# Patient Record
Sex: Male | Born: 1960
Health system: Southern US, Community
[De-identification: ages and names within clinical notes are randomized; demographics above are authoritative.]

## PROBLEM LIST (undated history)

## (undated) DIAGNOSIS — M79673 Pain in unspecified foot: Secondary | ICD-10-CM

## (undated) HISTORY — PX: INGUINAL HERNIA REPAIR: SUR1180

## (undated) HISTORY — DX: Pain in unspecified foot: M79.673

## (undated) HISTORY — PX: NECK SURGERY: SHX720

---

## 2002-05-21 ENCOUNTER — Ambulatory Visit (HOSPITAL_COMMUNITY): Admission: RE | Admit: 2002-05-21 | Discharge: 2002-05-21 | Payer: Self-pay | Admitting: Family Medicine

## 2002-05-21 ENCOUNTER — Encounter: Payer: Self-pay | Admitting: Family Medicine

## 2003-01-16 ENCOUNTER — Ambulatory Visit (HOSPITAL_COMMUNITY): Admission: RE | Admit: 2003-01-16 | Discharge: 2003-01-16 | Payer: Self-pay | Admitting: Internal Medicine

## 2003-01-16 ENCOUNTER — Encounter: Payer: Self-pay | Admitting: Internal Medicine

## 2003-01-17 ENCOUNTER — Encounter: Payer: Self-pay | Admitting: Orthopedic Surgery

## 2003-01-17 ENCOUNTER — Ambulatory Visit (HOSPITAL_COMMUNITY): Admission: RE | Admit: 2003-01-17 | Discharge: 2003-01-17 | Payer: Self-pay | Admitting: Orthopedic Surgery

## 2006-08-08 ENCOUNTER — Emergency Department (HOSPITAL_COMMUNITY): Admission: EM | Admit: 2006-08-08 | Discharge: 2006-08-08 | Payer: Self-pay | Admitting: Emergency Medicine

## 2011-04-08 LAB — CBC WITH DIFFERENTIAL/PLATELET
MCV: 101 fL
RBC: 4.15
WBC: 8.1
platelet count: 199

## 2011-04-08 LAB — COMPREHENSIVE METABOLIC PANEL
AST: 18 U/L
Albumin: 4.2
BUN: 9 mg/dL (ref 4–21)
CO2: 26 mmol/L
Calcium: 9.3 mg/dL
Chloride: 105 mmol/L
Glucose: 84 mg/dL
Potassium: 4.6 mmol/L

## 2011-04-13 ENCOUNTER — Telehealth: Payer: Self-pay

## 2011-04-13 NOTE — Telephone Encounter (Signed)
LMOM for pt to call. 

## 2011-05-03 NOTE — Telephone Encounter (Signed)
Letter mailed for pt to call.  

## 2011-05-24 ENCOUNTER — Telehealth: Payer: Self-pay

## 2011-05-24 NOTE — Telephone Encounter (Signed)
He left VM, i called and left him VM to call and schedule.

## 2011-05-24 NOTE — Telephone Encounter (Signed)
Pt called back to schedule and has seen blood on the tissue some. Scheduled for OV on 06/08/2011 with AS.

## 2011-06-08 ENCOUNTER — Ambulatory Visit (INDEPENDENT_AMBULATORY_CARE_PROVIDER_SITE_OTHER): Payer: Managed Care, Other (non HMO) | Admitting: Gastroenterology

## 2011-06-08 ENCOUNTER — Encounter: Payer: Self-pay | Admitting: Gastroenterology

## 2011-06-08 VITALS — BP 116/71 | HR 74 | Temp 98.4°F | Ht 74.0 in | Wt 231.2 lb

## 2011-06-08 DIAGNOSIS — K625 Hemorrhage of anus and rectum: Secondary | ICD-10-CM

## 2011-06-08 MED ORDER — SOD PICOSULFATE-MAG OX-CIT ACD 10-3.5-12 MG-GM-GM PO PACK: 1.0000 | PACK | Freq: Once | ORAL | Status: DC

## 2011-06-08 NOTE — Patient Instructions (Signed)
Start following a high fiber diet.   We haves set you up for a colonoscopy with Dr. Jena Gauss in the near future.   Further recommendations to follow.

## 2011-06-08 NOTE — Progress Notes (Signed)
Referring Provider: Dr. Regino Joseph Primary Care Physician:  Dr. Regino Joseph Primary Gastroenterologist:  Dr. Jena Joseph   Chief Complaint  Patient presents with  . Colonoscopy    HPI:   Chad Joseph presents today at the request of Dr. Regino Joseph for initial screening colonoscopy. Notes intermittent paper hematochezia. Remote hx of hemorrhoid flare but none currently. Denies constipation or diarrhea. No N/V, reflux, wt loss, lack of appetite. BC powders intermittently for headaches, up to every other day.   CBC from Dr. Edison Joseph office Nov 2012 without anemia.   Past Medical History  Diagnosis Date  . Foot pain     Past Surgical History  Procedure Date  . Neck surgery   . Inguinal hernia repair     Current Outpatient Prescriptions  Medication Sig Dispense Refill  . Aspirin-Salicylamide-Caffeine (BC HEADACHE POWDER PO) Take by mouth.      . hydrocortisone (ANUSOL-HC) 25 MG suppository Place 25 mg rectally 2 (two) times daily.       . meloxicam (MOBIC) 7.5 MG tablet Take 7.5 mg by mouth daily.       . Sod Picosulfate-Mag Ox-Cit Acd (PREPOPIK) 10-3.5-12 MG-GM-GM PACK Take 1 kit by mouth once.  1 each  0    Allergies as of 06/08/2011  . (No Known Allergies)    Family History  Problem Relation Age of Onset  . Colon cancer Neg Hx     History   Social History  . Marital Status: Married    Spouse Name: N/A    Number of Children: N/A  . 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 Smoking status: Current Some Day Smoker    Types: Cigars  . Smokeless tobacco: Not on file   Comment: one pack a week(5 in a pack)  . Alcohol Use: Yes     pint a week  . Drug Use: No  . Sexually Active: Not on file   Other Topics Concern  . Not on file   Social History Narrative  . No narrative on file    Review of Systems: Gen: Denies any fever, chills, loss of appetite, fatigue, weight loss. CV: Denies chest pain, heart  palpitations, syncope, peripheral edema. Resp: Denies shortness of breath with rest, cough, wheezing GI: Denies dysphagia or odynophagia. Denies hematemesis, fecal incontinence, or jaundice.  GU : Denies urinary burning, urinary frequency, urinary incontinence.  MS: Denies joint pain, muscle weakness, cramps, limited movement Derm: Denies rash, itching, dry skin Psych: Denies depression, anxiety, confusion or memory loss  Heme: Denies bruising, bleeding, and enlarged lymph nodes.  Physical Exam: BP 116/71  Pulse 74  Temp(Src) 98.4 F (36.9 C) (Temporal)  Ht 6\' 2"  (1.88 m)  Wt 231 lb 3.2 oz (104.872 kg)  BMI 29.68 kg/m2 General:   Alert and oriented. Well-developed, well-nourished, pleasant and cooperative. Head:  Normocephalic and atraumatic. Eyes:  Conjunctiva pink, sclera clear, no icterus.   Conjunctiva pink. Ears:  Normal auditory acuity. Nose:  No deformity, discharge,  or lesions. Mouth:  No deformity or lesions, mucosa pink and moist.  Neck:  Supple, without mass or thyromegaly. Lungs:  Clear to auscultation bilaterally, without wheezing, rales, or rhonchi.  Heart:  S1, S2 present without murmurs noted.  Abdomen:  +BS, soft, non-tender and non-distended. Without mass or HSM. No rebound or guarding. No hernias noted. Rectal:  Deferred  Msk:  Symmetrical without gross deformities. Normal posture. Pulses:  Normal pulses noted. Extremities:  Without clubbing or edema. Neurologic:  Alert and  oriented x4;  grossly normal neurologically. Skin:  Intact, warm and dry without significant lesions or rashes Cervical Nodes:  No significant cervical adenopathy. Psych:  Alert and cooperative. Normal mood and affect.

## 2011-06-09 ENCOUNTER — Encounter: Payer: Self-pay | Admitting: Gastroenterology

## 2011-06-09 DIAGNOSIS — K625 Hemorrhage of anus and rectum: Secondary | ICD-10-CM | POA: Insufficient documentation

## 2011-06-09 NOTE — Assessment & Plan Note (Signed)
51 year old male with need for screening colonoscopy in near future. Intermittent paper hematochezia noted recently, no other issues such as change in bowel habits, abdominal pain. Devoid of upper GI symptoms. Taking Goody's powders intermittently for headaches. Counseled to stop this. Likely low-volume hematochezia due to benign anorectal source. Proceed with TCS in near future.  Proceed with TCS with Dr. Jena Gauss in near future: the risks, benefits, and alternatives have been discussed with the patient in detail. The patient states understanding and desires to proceed. Avoid Goody's powders

## 2011-06-10 NOTE — Progress Notes (Signed)
Faxed to PCP

## 2011-07-07 MED ORDER — SODIUM CHLORIDE 0.45 % IV SOLN
Freq: Once | INTRAVENOUS | Status: AC
  Administered 2011-07-08: 09:00:00 via INTRAVENOUS

## 2011-07-08 ENCOUNTER — Other Ambulatory Visit: Payer: Self-pay | Admitting: Internal Medicine

## 2011-07-08 ENCOUNTER — Encounter (HOSPITAL_COMMUNITY): Payer: Self-pay | Admitting: *Deleted

## 2011-07-08 ENCOUNTER — Encounter (HOSPITAL_COMMUNITY)
Admission: RE | Disposition: A | Discharge status: Home / Self Care | Payer: Self-pay | Source: Ambulatory Visit | Attending: Internal Medicine

## 2011-07-08 ENCOUNTER — Ambulatory Visit (HOSPITAL_COMMUNITY)
Admission: RE | Admit: 2011-07-08 | Discharge: 2011-07-08 | Disposition: A | Discharge status: Home / Self Care | Payer: Managed Care, Other (non HMO) | Source: Ambulatory Visit | Attending: Internal Medicine | Admitting: Internal Medicine

## 2011-07-08 DIAGNOSIS — K625 Hemorrhage of anus and rectum: Secondary | ICD-10-CM

## 2011-07-08 DIAGNOSIS — D126 Benign neoplasm of colon, unspecified: Secondary | ICD-10-CM

## 2011-07-08 DIAGNOSIS — K921 Melena: Secondary | ICD-10-CM

## 2011-07-08 DIAGNOSIS — Z1211 Encounter for screening for malignant neoplasm of colon: Secondary | ICD-10-CM | POA: Insufficient documentation

## 2011-07-08 HISTORY — PX: COLONOSCOPY: SHX5424

## 2011-07-08 SURGERY — COLONOSCOPY
Anesthesia: Moderate Sedation

## 2011-07-08 MED ORDER — MIDAZOLAM HCL 5 MG/5ML IJ SOLN
INTRAMUSCULAR | Status: DC | PRN
  Administered 2011-07-08: 2 mg via INTRAVENOUS
  Administered 2011-07-08: 1 mg via INTRAVENOUS

## 2011-07-08 MED ORDER — MEPERIDINE HCL 100 MG/ML IJ SOLN
INTRAMUSCULAR | Status: DC | PRN
  Administered 2011-07-08: 25 mg via INTRAVENOUS
  Administered 2011-07-08: 50 mg via INTRAVENOUS

## 2011-07-08 MED ORDER — MIDAZOLAM HCL 5 MG/5ML IJ SOLN
INTRAMUSCULAR | Status: AC
  Filled 2011-07-08: qty 10

## 2011-07-08 MED ORDER — MEPERIDINE HCL 100 MG/ML IJ SOLN
INTRAMUSCULAR | Status: AC
  Filled 2011-07-08: qty 2

## 2011-07-08 MED ORDER — STERILE WATER FOR IRRIGATION IR SOLN
Status: DC | PRN
  Administered 2011-07-08: 09:00:00

## 2011-07-08 NOTE — H&P (View-Only) (Signed)
Referring Provider: Dr. McGough Primary Care Physician:  Dr. McGough Primary Gastroenterologist:  Dr. Rourk   Chief Complaint  Patient presents with  . Colonoscopy    HPI:   Chad Joseph presents today at the request of Dr. McGough for initial screening colonoscopy. Notes intermittent paper hematochezia. Remote hx of hemorrhoid flare but none currently. Denies constipation or diarrhea. No N/V, reflux, wt loss, lack of appetite. BC powders intermittently for headaches, up to every other day.   CBC from Dr. McGough's office Nov 2012 without anemia.   Past Medical History  Diagnosis Date  . Foot pain     Past Surgical History  Procedure Date  . Neck surgery   . Inguinal hernia repair     Current Outpatient Prescriptions  Medication Sig Dispense Refill  . Aspirin-Salicylamide-Caffeine (BC HEADACHE POWDER PO) Take by mouth.      . hydrocortisone (ANUSOL-HC) 25 MG suppository Place 25 mg rectally 2 (two) times daily.       . meloxicam (MOBIC) 7.5 MG tablet Take 7.5 mg by mouth daily.       . Sod Picosulfate-Mag Ox-Cit Acd (PREPOPIK) 10-3.5-12 MG-GM-GM PACK Take 1 kit by mouth once.  1 each  0    Allergies as of 06/08/2011  . (No Known Allergies)    Family History  Problem Relation Age of Onset  . Colon cancer Neg Hx     History   Social History  . Marital Status: Married    Spouse Name: N/A    Number of Children: N/A  . Years of Education: N/A   Occupational History  . [employer]    Social History Main Topics  . Smoking status: Current Some Day Smoker    Types: Cigars  . Smokeless tobacco: Not on file   Comment: one pack a week(5 in a pack)  . Alcohol Use: Yes     pint a week  . Drug Use: No  . Sexually Active: Not on file   Other Topics Concern  . Not on file   Social History Narrative  . No narrative on file    Review of Systems: Gen: Denies any fever, chills, loss of appetite, fatigue, weight loss. CV: Denies chest pain, heart  palpitations, syncope, peripheral edema. Resp: Denies shortness of breath with rest, cough, wheezing GI: Denies dysphagia or odynophagia. Denies hematemesis, fecal incontinence, or jaundice.  GU : Denies urinary burning, urinary frequency, urinary incontinence.  MS: Denies joint pain, muscle weakness, cramps, limited movement Derm: Denies rash, itching, dry skin Psych: Denies depression, anxiety, confusion or memory loss  Heme: Denies bruising, bleeding, and enlarged lymph nodes.  Physical Exam: BP 116/71  Pulse 74  Temp(Src) 98.4 F (36.9 C) (Temporal)  Ht 6' 2" (1.88 m)  Wt 231 lb 3.2 oz (104.872 kg)  BMI 29.68 kg/m2 General:   Alert and oriented. Well-developed, well-nourished, pleasant and cooperative. Head:  Normocephalic and atraumatic. Eyes:  Conjunctiva pink, sclera clear, no icterus.   Conjunctiva pink. Ears:  Normal auditory acuity. Nose:  No deformity, discharge,  or lesions. Mouth:  No deformity or lesions, mucosa pink and moist.  Neck:  Supple, without mass or thyromegaly. Lungs:  Clear to auscultation bilaterally, without wheezing, rales, or rhonchi.  Heart:  S1, S2 present without murmurs noted.  Abdomen:  +BS, soft, non-tender and non-distended. Without mass or HSM. No rebound or guarding. No hernias noted. Rectal:  Deferred  Msk:  Symmetrical without gross deformities. Normal posture. Pulses:  Normal pulses noted. Extremities:    Without clubbing or edema. Neurologic:  Alert and  oriented x4;  grossly normal neurologically. Skin:  Intact, warm and dry without significant lesions or rashes Cervical Nodes:  No significant cervical adenopathy. Psych:  Alert and cooperative. Normal mood and affect.   

## 2011-07-08 NOTE — Interval H&P Note (Signed)
History and Physical Interval Note:  07/08/2011 9:14 AM  Chad Joseph  has presented today for surgery, with the diagnosis of RECTAL BLEEDING  The various methods of treatment have been discussed with the patient and family. After consideration of risks, benefits and other options for treatment, the patient has consented to  Procedure(s) (LRB): COLONOSCOPY (N/A) as a surgical intervention .  The patients' history has been reviewed, patient examined, no change in status, stable for surgery.  I have reviewed the patients' chart and labs.  Questions were answered to the patient's satisfaction.     Eula Listen

## 2011-07-08 NOTE — Discharge Instructions (Addendum)
Colonoscopy Discharge Instructions  Read the instructions outlined below and refer to this sheet in the next few weeks. These discharge instructions provide you with general information on caring for yourself after you leave the hospital. Your doctor may also give you specific instructions. While your treatment has been planned according to the most current medical practices available, unavoidable complications occasionally occur. If you have any problems or questions after discharge, call Dr. Jena Gauss at [phone]. ACTIVITY  You may resume your regular activity, but move at a slower pace for the next 24 hours.   Take frequent rest periods for the next 24 hours.   Walking will help get rid of the air and reduce the bloated feeling in your belly (abdomen).   No driving for 24 hours (because of the medicine (anesthesia) used during the test).    Do not sign any important legal documents or operate any machinery for 24 hours (because of the anesthesia used during the test).  NUTRITION  Drink plenty of fluids.   You may resume your normal diet as instructed by your doctor.   Begin with a light meal and progress to your normal diet. Heavy or fried foods are harder to digest and may make you feel sick to your stomach (nauseated).   Avoid alcoholic beverages for 24 hours or as instructed.  MEDICATIONS  You may resume your normal medications unless your doctor tells you otherwise.  WHAT YOU CAN EXPECT TODAY  Some feelings of bloating in the abdomen.   Passage of more gas than usual.   Spotting of blood in your stool or on the toilet paper.  IF YOU HAD POLYPS REMOVED DURING THE COLONOSCOPY:  No aspirin products for 7 days or as instructed.   No alcohol for 7 days or as instructed.   Eat a soft diet for the next 24 hours.  FINDING OUT THE RESULTS OF YOUR TEST Not all test results are available during your visit. If your test results are not back during the visit, make an appointment  with your caregiver to find out the results. Do not assume everything is normal if you have not heard from your caregiver or the medical facility. It is important for you to follow up on all of your test results.  SEEK IMMEDIATE MEDICAL ATTENTION IF:  You have more than a spotting of blood in your stool.   Your belly is swollen (abdominal distention).   You are nauseated or vomiting.   You have a temperature over 101.   You have abdominal pain or discomfort that is severe or gets worse throughout the day.    Polyp and hemorrhoid information provided.  Daily Benefiber-  fiber supplement 2 tablespoons.  One-week course of Anusol suppositories as directed.  Further recommendations to follow pending review of pathology report.Hemorrhoids Hemorrhoids are enlarged (dilated) veins around the rectum. There are 2 types of hemorrhoids, and the type of hemorrhoid is determined by its location. Internal hemorrhoids occur in the veins just inside the rectum.They are usually not painful, but they may bleed.However, they may poke through to the outside and become irritated and painful. External hemorrhoids involve the veins outside the anus and can be felt as a painful swelling or hard lump near the anus.They are often itchy and may crack and bleed. Sometimes clots will form in the veins. This makes them swollen and painful. These are called thrombosed hemorrhoids. CAUSES Causes of hemorrhoids include:  Pregnancy. This increases the pressure in the hemorrhoidal veins.  Constipation.   Straining to have a bowel movement.   Obesity.   Heavy lifting or other activity that caused you to strain.  TREATMENT Most of the time hemorrhoids improve in 1 to 2 weeks. However, if symptoms do not seem to be getting better or if you have a lot of rectal bleeding, your caregiver may perform a procedure to help make the hemorrhoids get smaller or remove them completely.Possible treatments include:  Rubber  band ligation. A rubber band is placed at the base of the hemorrhoid to cut off the circulation.   Sclerotherapy. A chemical is injected to shrink the hemorrhoid.   Infrared light therapy. Tools are used to burn the hemorrhoid.   Hemorrhoidectomy. This is surgical removal of the hemorrhoid.  HOME CARE INSTRUCTIONS   Increase fiber in your diet. Ask your caregiver about using fiber supplements.   Drink enough water and fluids to keep your urine clear or pale yellow.   Exercise regularly.   Go to the bathroom when you have the urge to have a bowel movement. Do not wait.   Avoid straining to have bowel movements.   Keep the anal area dry and clean.   Only take over-the-counter or prescription medicines for pain, discomfort, or fever as directed by your caregiver.  If your hemorrhoids are thrombosed:  Take warm sitz baths for 20 to 30 minutes, 3 to 4 times per day.   If the hemorrhoids are very tender and swollen, place ice packs on the area as tolerated. Using ice packs between sitz baths may be helpful. Fill a plastic bag with ice. Place a towel between the bag of ice and your skin.   Medicated creams and suppositories may be used or applied as directed.   Do not use a donut-shaped pillow or sit on the toilet for long periods. This increases blood pooling and pain.  SEEK MEDICAL CARE IF:   You have increasing pain and swelling that is not controlled with your medicine.   You have uncontrolled bleeding.   You have difficulty or you are unable to have a bowel movement.   You have pain or inflammation outside the area of the hemorrhoids.   You have chills or an oral temperature above 102 F (38.9 C).  MAKE SURE YOU:   Understand these instructions.   Will watch your condition.   Will get help right away if you are not doing well or get worse.  Document Released: 04/29/2000 Document Revised: 01/12/2011 Document Reviewed: 09/04/2007 [facility] Patient Information 2012  [place], [place].Colon Polyps A polyp is extra tissue that grows inside your body. Colon polyps grow in the large intestine. The large intestine, also called the colon, is part of your digestive system. It is a long, hollow tube at the end of your digestive tract where your body makes and stores stool. Most polyps are not dangerous. They are benign. This means they are not cancerous. But over time, some types of polyps can turn into cancer. Polyps that are smaller than a pea are usually not harmful. But larger polyps could someday become or may already be cancerous. To be safe, doctors remove all polyps and test them.  WHO GETS POLYPS? Anyone can get polyps, but certain people are more likely than others. You may have a greater chance of getting polyps if:  You are over 50.   You have had polyps before.   Someone in your family has had polyps.   Someone in your family has had cancer  of the large intestine.   Find out if someone in your family has had polyps. You may also be more likely to get polyps if you:   Eat a lot of fatty foods.   Smoke.   Drink alcohol.   Do not exercise.   Eat too much.  SYMPTOMS  Most small polyps do not cause symptoms. People often do not know they have one until their caregiver finds it during a regular checkup or while testing them for something else. Some people do have symptoms like these:  Bleeding from the anus. You might notice blood on your underwear or on toilet paper after you have had a bowel movement.   Constipation or diarrhea that lasts more than a week.   Blood in the stool. Blood can make stool look black or it can show up as red streaks in the stool.  If you have any of these symptoms, see your caregiver. HOW DOES THE DOCTOR TEST FOR POLYPS? The doctor can use four tests to check for polyps:  Digital rectal exam. The caregiver wears gloves and checks your rectum (the last part of the large intestine) to see if it feels normal. This test  would find polyps only in the rectum. Your caregiver may need to do one of the other tests listed below to find polyps higher up in the intestine.   Barium enema. The caregiver puts a liquid called barium into your rectum before taking x-rays of your large intestine. Barium makes your intestine look white in the pictures. Polyps are dark, so they are easy to see.   Sigmoidoscopy. With this test, the caregiver can see inside your large intestine. A thin flexible tube is placed into your rectum. The device is called a sigmoidoscope, which has a light and a tiny video camera in it. The caregiver uses the sigmoidoscope to look at the last third of your large intestine.   Colonoscopy. This test is like sigmoidoscopy, but the caregiver looks at all of the large intestine. It usually requires sedation. This is the most common method for finding and removing polyps.  TREATMENT   The caregiver will remove the polyp during sigmoidoscopy or colonoscopy. The polyp is then tested for cancer.   If you have had polyps, your caregiver may want you to get tested regularly in the future.  PREVENTION  There is not one sure way to prevent polyps. You might be able to lower your risk of getting them if you:  Eat more fruits and vegetables and less fatty food.   Do not smoke.   Avoid alcohol.   Exercise every day.   Lose weight if you are overweight.   Eating more calcium and folate can also lower your risk of getting polyps. Some foods that are rich in calcium are milk, cheese, and broccoli. Some foods that are rich in folate are chickpeas, kidney beans, and spinach.   Aspirin might help prevent polyps. Studies are under way.  Document Released: 01/27/2004 Document Revised: 01/12/2011 Document Reviewed: 07/04/2007 [facility] Patient Information 2012 [place], [place].

## 2011-07-08 NOTE — Op Note (Signed)
[facility] [address] [place], [place]  [address]  COLONOSCOPY PROCEDURE REPORT  PATIENT:  Chad Joseph, Chad Joseph  MR#:  [id number] BIRTHDATE:  1960-06-30, 50 yrs. old  GENDER:  male ENDOSCOPIST:  R. Roetta Sessions, MD FACP [facility] REF. BY:  Karleen Hampshire, M.D. PROCEDURE DATE:  07/08/2011 PROCEDURE:  colonoscopy with snare polypectomy  INDICATIONS:  First ever average risk screening colonoscopy; He does report occasional small amount of blood per rectum-painless  INFORMED CONSENT:  The risks, benefits, alternatives and imponderables including but not limited to bleeding, perforation as well as the possibility of a missed lesion have been reviewed. The potential for biopsy, lesion removal, etc. have also been discussed.  Questions have been answered.  All parties agreeable. Please see the history and physical in the medical record for more information.  MEDICATIONS:  Versed 3 mg IV and Demerol 75 mg IV in divided doses.  DESCRIPTION OF PROCEDURE:  After a digital rectal exam was performed, the EC-[id number] ([id number]) colonoscope was advanced from the anus through the rectum and colon to the area of the cecum, ileocecal valve and appendiceal orifice.  The cecum was deeply intubated.  These structures were well-seen and photographed for the record.  From the level of the cecum and ileocecal valve, the scope was slowly and cautiously withdrawn.  The mucosal surfaces were carefully surveyed utilizing scope tip deflection to facilitate fold flattening as needed.  Poor prep.  The scope was pulled down into the rectum where a thorough examination. Carried out;  I attempted to perform retroflexion but was unable to do so. <<PROCEDUREIMAGES>>  FINDINGS:  marginal preparation. A small lesion may have  been obscured by the poor prep.  Somewhat friable anorectum. A 5 mm flat adenomatous-appearing polyp in the sigmoid segment; otherwise the colonic mucosa appeared grossly  normal.  THERAPEUTIC / DIAGNOSTIC MANEUVERS PERFORMED:   The above-mentioned polyp was hot snare removed.  COMPLICATIONS:  none  CECAL WITHDRAWAL TIME:   16 minutes.  IMPRESSION:  Friable anorectum-likely source of hematochezia. Sigmoid polyp-removed as described above. Poor prep limited the examination.  RECOMMENDATIONS:   Follow-up on pathology. Add Benefiber-fiber supplement daily. Continue Anusol suppositories. Further recommendations to follow  ______________________________ R. Roetta Sessions, MD Caleen Essex  CC:  Karleen Hampshire, M.D.  n. eSIGNED:   R. Roetta Sessions at 07/08/2011 10:14 AM  Evelena Asa,   [phone]

## 2011-07-13 ENCOUNTER — Encounter (HOSPITAL_COMMUNITY): Payer: Self-pay | Admitting: Internal Medicine

## 2011-07-16 ENCOUNTER — Encounter: Payer: Self-pay | Admitting: Internal Medicine

## 2016-08-17 DIAGNOSIS — Z0000 Encounter for general adult medical examination without abnormal findings: Secondary | ICD-10-CM | POA: Diagnosis not present

## 2016-08-17 DIAGNOSIS — D539 Nutritional anemia, unspecified: Secondary | ICD-10-CM | POA: Diagnosis not present

## 2016-08-17 DIAGNOSIS — Z6828 Body mass index (BMI) 28.0-28.9, adult: Secondary | ICD-10-CM | POA: Diagnosis not present

## 2016-08-17 DIAGNOSIS — Z1389 Encounter for screening for other disorder: Secondary | ICD-10-CM | POA: Diagnosis not present

## 2016-11-25 DIAGNOSIS — E663 Overweight: Secondary | ICD-10-CM | POA: Diagnosis not present

## 2016-11-25 DIAGNOSIS — D509 Iron deficiency anemia, unspecified: Secondary | ICD-10-CM | POA: Diagnosis not present

## 2016-11-25 DIAGNOSIS — Z1389 Encounter for screening for other disorder: Secondary | ICD-10-CM | POA: Diagnosis not present

## 2016-11-25 DIAGNOSIS — Z6828 Body mass index (BMI) 28.0-28.9, adult: Secondary | ICD-10-CM | POA: Diagnosis not present

## 2017-02-24 DIAGNOSIS — Z1389 Encounter for screening for other disorder: Secondary | ICD-10-CM | POA: Diagnosis not present

## 2017-02-24 DIAGNOSIS — E663 Overweight: Secondary | ICD-10-CM | POA: Diagnosis not present

## 2017-02-24 DIAGNOSIS — M5410 Radiculopathy, site unspecified: Secondary | ICD-10-CM | POA: Diagnosis not present

## 2017-02-24 DIAGNOSIS — Z6828 Body mass index (BMI) 28.0-28.9, adult: Secondary | ICD-10-CM | POA: Diagnosis not present

## 2017-06-26 ENCOUNTER — Encounter: Payer: Self-pay | Admitting: Adult Health

## 2017-06-26 NOTE — Progress Notes (Signed)
This encounter was created in error - please disregard.

## 2017-07-28 DIAGNOSIS — Z1389 Encounter for screening for other disorder: Secondary | ICD-10-CM | POA: Diagnosis not present

## 2017-07-28 DIAGNOSIS — J029 Acute pharyngitis, unspecified: Secondary | ICD-10-CM | POA: Diagnosis not present

## 2017-07-28 DIAGNOSIS — E663 Overweight: Secondary | ICD-10-CM | POA: Diagnosis not present

## 2017-07-28 DIAGNOSIS — Z6828 Body mass index (BMI) 28.0-28.9, adult: Secondary | ICD-10-CM | POA: Diagnosis not present

## 2019-06-25 DIAGNOSIS — Z6828 Body mass index (BMI) 28.0-28.9, adult: Secondary | ICD-10-CM | POA: Diagnosis not present

## 2019-06-25 DIAGNOSIS — Z1389 Encounter for screening for other disorder: Secondary | ICD-10-CM | POA: Diagnosis not present

## 2019-06-25 DIAGNOSIS — E663 Overweight: Secondary | ICD-10-CM | POA: Diagnosis not present

## 2019-06-25 DIAGNOSIS — Z0000 Encounter for general adult medical examination without abnormal findings: Secondary | ICD-10-CM | POA: Diagnosis not present

## 2019-07-14 ENCOUNTER — Other Ambulatory Visit: Payer: Self-pay

## 2019-07-14 ENCOUNTER — Ambulatory Visit: Payer: Self-pay | Attending: Internal Medicine

## 2019-07-14 DIAGNOSIS — Z23 Encounter for immunization: Secondary | ICD-10-CM | POA: Insufficient documentation

## 2019-07-14 NOTE — Progress Notes (Signed)
   Covid-19 Vaccination Clinic  Name:  Chad Joseph    MRN: [id number] DOB: 10-Nov-1960  07/14/2019  Mr. [name] was observed post Covid-19 immunization for 15 minutes without incidence. He was provided with Vaccine Information Sheet and instruction to access the V-Safe system.   Mr. [name] was instructed to call 911 with any severe reactions post vaccine: Marland Kitchen Difficulty breathing  . Swelling of your face and throat  . A fast heartbeat  . A bad rash all over your body  . Dizziness and weakness    Immunizations Administered    Name Date Dose VIS Date Route   Moderna COVID-19 Vaccine 07/14/2019  9:05 AM 0.5 mL 04/16/2019 Intramuscular   Manufacturer: Moderna   Lot: [id number]   [id number]

## 2019-08-17 ENCOUNTER — Ambulatory Visit: Payer: Self-pay | Attending: Internal Medicine

## 2019-08-17 DIAGNOSIS — Z23 Encounter for immunization: Secondary | ICD-10-CM

## 2019-08-17 NOTE — Progress Notes (Signed)
   Covid-19 Vaccination Clinic  Name:  Chad Joseph    MRN: [id number] DOB: 05-14-1961  08/17/2019  Chad Joseph was observed post Covid-19 immunization for 15 minutes without incident. He was provided with Vaccine Information Sheet and instruction to access the V-Safe system.   Chad Joseph was instructed to call 911 with any severe reactions post vaccine: Marland Kitchen Difficulty breathing  . Swelling of face and throat  . A fast heartbeat  . A bad rash all over body  . Dizziness and weakness   Immunizations Administered    Name Date Dose VIS Date Route   Moderna COVID-19 Vaccine 08/17/2019  9:05 AM 0.5 mL 04/16/2019 Intramuscular   Manufacturer: Moderna   Lot: [id number]   [id number]

## 2020-03-12 ENCOUNTER — Other Ambulatory Visit: Payer: Self-pay

## 2020-03-12 ENCOUNTER — Ambulatory Visit
Admission: EM | Admit: 2020-03-12 | Discharge: 2020-03-12 | Disposition: A | Discharge status: Home / Self Care | Payer: BC Managed Care – PPO | Attending: Emergency Medicine | Admitting: Emergency Medicine

## 2020-03-12 DIAGNOSIS — Z1152 Encounter for screening for COVID-19: Secondary | ICD-10-CM | POA: Diagnosis not present

## 2020-03-12 DIAGNOSIS — J069 Acute upper respiratory infection, unspecified: Secondary | ICD-10-CM

## 2020-03-12 MED ORDER — DEXAMETHASONE 4 MG PO TABS: 4.0000 mg | ORAL_TABLET | Freq: Every day | ORAL | 0 refills | Status: AC

## 2020-03-12 MED ORDER — FLUTICASONE PROPIONATE 50 MCG/ACT NA SUSP: 1.0000 | Freq: Every day | NASAL | 0 refills | Status: DC

## 2020-03-12 MED ORDER — CETIRIZINE HCL 10 MG PO TABS: 10.0000 mg | ORAL_TABLET | Freq: Every day | ORAL | 0 refills | Status: DC

## 2020-03-12 MED ORDER — BENZONATATE 100 MG PO CAPS: 100.0000 mg | ORAL_CAPSULE | Freq: Three times a day (TID) | ORAL | 0 refills | Status: DC

## 2020-03-12 NOTE — ED Triage Notes (Signed)
Pt presents with nasal congestion for past couple of days

## 2020-03-12 NOTE — Discharge Instructions (Signed)
COVID testing ordered.  It will take between 5-7 days for test results.  Someone will contact you regarding abnormal results.    In the meantime: You should remain isolated in your home for 10 days from symptom onset AND greater than 72 hours after symptoms resolution (absence of fever without the use of fever-reducing medication and improvement in respiratory symptoms), whichever is longer Get plenty of rest and push fluids Tessalon Perles prescribed for cough Zyrtec for nasal congestion, runny nose, and/or sore throat flonase for nasal congestion and runny nose Decadron was prescribed Use medications daily for symptom relief Use OTC medications like ibuprofen or tylenol as needed fever or pain Call or go to the ED if you have any new or worsening symptoms such as fever, worsening cough, shortness of breath, chest tightness, chest pain, turning blue, changes in mental status, etc..Marland Kitchen

## 2020-03-12 NOTE — ED Provider Notes (Signed)
[name]   [id number] 03/12/20 Arrival Time: 2482   CC: COVID symptoms  SUBJECTIVE: History from: patient.  Chad Joseph is a 59 y.o. male who presents with abrupt onset of nasal congestion, and cough for 2 to 3 days.  Denies sick exposure to COVID, flu or strep.  Denies recent travel.  Has tried OTC medication without relief.  Denies aggravating factors.  Denies previous symptoms in the past.   Denies fever, chills, fatigue, sinus pain, rhinorrhea, sore throat, SOB, wheezing, chest pain, nausea, changes in bowel or bladder habits.      ROS: As per HPI.  All other pertinent ROS negative.     Past Medical History:  Diagnosis Date  . Foot pain   . Hemorrhoids    Past Surgical History:  Procedure Laterality Date  . COLONOSCOPY  07/08/2011   Procedure: COLONOSCOPY;  Surgeon: Daneil Dolin, MD;  Location: [facility];  Service: Endoscopy;  Laterality: N/A;  9:45  . INGUINAL HERNIA REPAIR    . NECK SURGERY     No Known Allergies No current facility-administered medications on file prior to encounter.   Current Outpatient Medications on File Prior to Encounter  Medication Sig Dispense Refill  . Aspirin-Salicylamide-Caffeine (BC HEADACHE POWDER PO) Take by mouth.    . hydrocortisone (ANUSOL-HC) 25 MG suppository Place 25 mg rectally 2 (two) times daily.     . meloxicam (MOBIC) 7.5 MG tablet Take 7.5 mg by mouth daily.     . Sod Picosulfate-Mag Ox-Cit Acd ([place]) 10-3.5-12 MG-GM-GM PACK Take 1 kit by mouth once. 1 each 0   Social History   Socioeconomic History  . Marital status: Married    Spouse name: Not on file  . Number of children: Not on file  . Years of education: Not on file  . Highest education level: Not on file  Occupational History  . Occupation: Dentist  Tobacco Use  . Smoking status: Current Some Day Smoker    Years: 1.00    Types: Cigars  . Tobacco comment: one pack a week(5 in a pack)  Substance and Sexual  Activity  . Alcohol use: Yes    Comment: pint a week  . Drug use: No  . Sexual activity: Not on file  Other Topics Concern  . Not on file  Social History Narrative  . Not on file   Social Determinants of Health   Financial Resource Strain:   . Difficulty of Paying Living Expenses: Not on file  Food Insecurity:   . Worried About Charity fundraiser in the Last Year: Not on file  . Ran Out of Food in the Last Year: Not on file  Transportation Needs:   . Lack of Transportation (Medical): Not on file  . Lack of Transportation (Non-Medical): Not on file  Physical Activity:   . Days of Exercise per Week: Not on file  . Minutes of Exercise per Session: Not on file  Stress:   . Feeling of Stress : Not on file  Social Connections:   . Frequency of Communication with Friends and Family: Not on file  . Frequency of Social Gatherings with Friends and Family: Not on file  . Attends Religious Services: Not on file  . Active Member of Clubs or Organizations: Not on file  . Attends Archivist Meetings: Not on file  . Marital Status: Not on file  Intimate Partner Violence:   . Fear of Current or Ex-Partner: Not on  file  . Emotionally Abused: Not on file  . Physically Abused: Not on file  . Sexually Abused: Not on file   Family History  Problem Relation Age of Onset  . Colon cancer Neg Hx     OBJECTIVE:  Vitals:   03/12/20 0904  BP: 111/71  Pulse: 79  Resp: 18  Temp: 98.9 F (37.2 C)  SpO2: 98%     General appearance: alert; appears fatigued, but nontoxic; speaking in full sentences and tolerating own secretions HEENT: NCAT; Ears: EACs clear, TMs pearly gray; Eyes: PERRL.  EOM grossly intact. Sinuses: nontender; Nose: nares patent without rhinorrhea, Throat: oropharynx clear, tonsils non erythematous or enlarged, uvula midline  Neck: supple without LAD Lungs: unlabored respirations, symmetrical air entry; cough: moderate; no respiratory distress; CTAB Heart: regular  rate and rhythm.  Radial pulses 2+ symmetrical bilaterally Skin: warm and dry Psychological: alert and cooperative; normal mood and affect  LABS:  No results found for this or any previous visit (from the past 24 hour(s)).   ASSESSMENT & PLAN:  1. URI with cough and congestion   2. Encounter for screening for COVID-19     Meds ordered this encounter  Medications  . cetirizine (ZYRTEC ALLERGY) 10 MG tablet    Sig: Take 1 tablet (10 mg total) by mouth daily.    Dispense:  30 tablet    Refill:  0  . fluticasone (FLONASE) 50 MCG/ACT nasal spray    Sig: Place 1 spray into both nostrils daily for 14 days.    Dispense:  16 g    Refill:  0  . benzonatate (TESSALON) 100 MG capsule    Sig: Take 1 capsule (100 mg total) by mouth every 8 (eight) hours.    Dispense:  30 capsule    Refill:  0  . dexamethasone (DECADRON) 4 MG tablet    Sig: Take 1 tablet (4 mg total) by mouth daily for 7 days.    Dispense:  7 tablet    Refill:  0    Discharge instructions  COVID testing ordered.  It will take between 5-7 days for test results.  Someone will contact you regarding abnormal results.    In the meantime: You should remain isolated in your home for 10 days from symptom onset AND greater than 72 hours after symptoms resolution (absence of fever without the use of fever-reducing medication and improvement in respiratory symptoms), whichever is longer Get plenty of rest and push fluids Tessalon Perles prescribed for cough Zyrtec for nasal congestion, runny nose, and/or sore throat flonase for nasal congestion and runny nose Decadron was prescribed Use medications daily for symptom relief Use OTC medications like ibuprofen or tylenol as needed fever or pain Call or go to the ED if you have any new or worsening symptoms such as fever, worsening cough, shortness of breath, chest tightness, chest pain, turning blue, changes in mental status, etc...   Reviewed expectations re: course of current  medical issues. Questions answered. Outlined signs and symptoms indicating need for more acute intervention. Patient verbalized understanding. After Visit Summary given.         Emerson Monte, [place] 03/12/20 [phone]

## 2020-03-13 LAB — NOVEL CORONAVIRUS, NAA: SARS-CoV-2, NAA: NOT DETECTED

## 2020-03-13 LAB — SARS-COV-2, NAA 2 DAY TAT

## 2020-06-08 ENCOUNTER — Ambulatory Visit (HOSPITAL_COMMUNITY)
Admission: RE | Admit: 2020-06-08 | Discharge: 2020-06-08 | Disposition: A | Discharge status: Home / Self Care | Payer: BC Managed Care – PPO | Source: Ambulatory Visit | Attending: Physician Assistant | Admitting: Physician Assistant

## 2020-06-08 ENCOUNTER — Other Ambulatory Visit (HOSPITAL_COMMUNITY): Payer: Self-pay | Admitting: Physician Assistant

## 2020-06-08 ENCOUNTER — Other Ambulatory Visit: Payer: Self-pay

## 2020-06-08 DIAGNOSIS — M79671 Pain in right foot: Secondary | ICD-10-CM

## 2020-06-08 DIAGNOSIS — M25572 Pain in left ankle and joints of left foot: Secondary | ICD-10-CM

## 2020-06-08 DIAGNOSIS — M25571 Pain in right ankle and joints of right foot: Secondary | ICD-10-CM | POA: Insufficient documentation

## 2020-06-08 DIAGNOSIS — M79672 Pain in left foot: Secondary | ICD-10-CM | POA: Diagnosis present

## 2021-07-22 ENCOUNTER — Encounter: Payer: Self-pay | Admitting: *Deleted

## 2021-12-26 IMAGING — DX DG ANKLE COMPLETE 3+V*L*
3 series · 3 of 3 positions shown · non-contrast
Comparison: None.

CLINICAL DATA: Bilateral ankle pain

EXAM:
LEFT ANKLE COMPLETE - 3+ VIEW

[ankle ap]
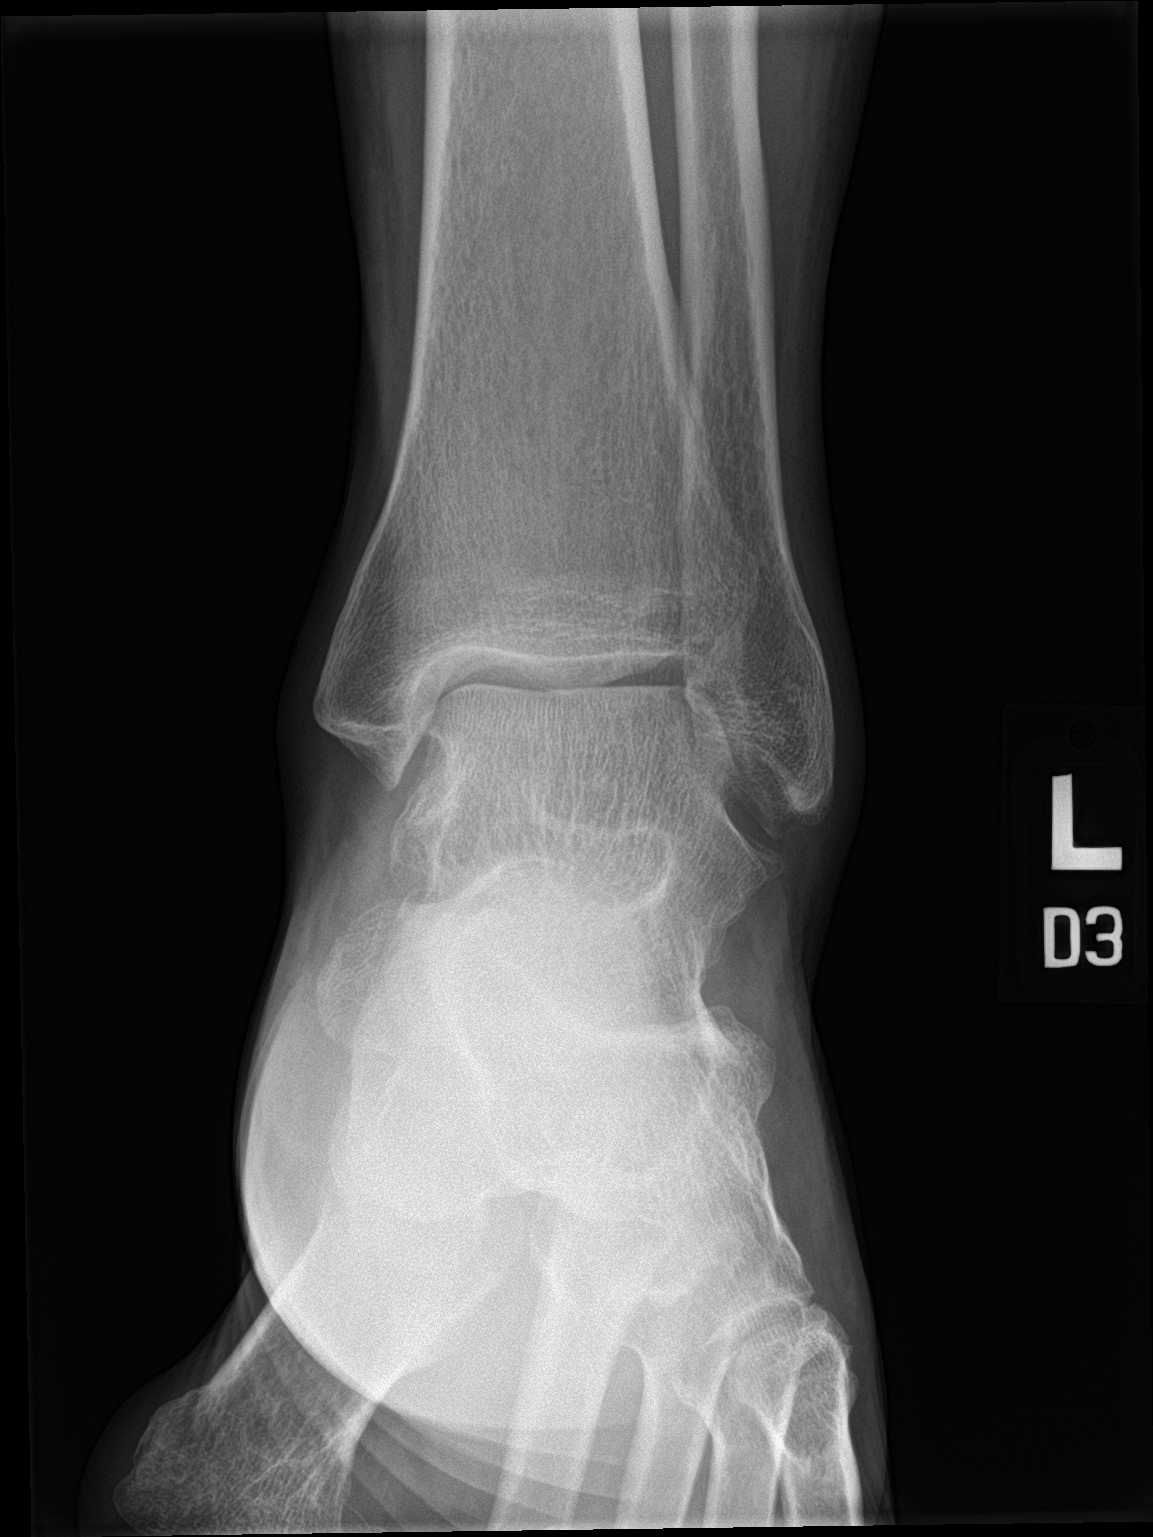

[ankle obl]
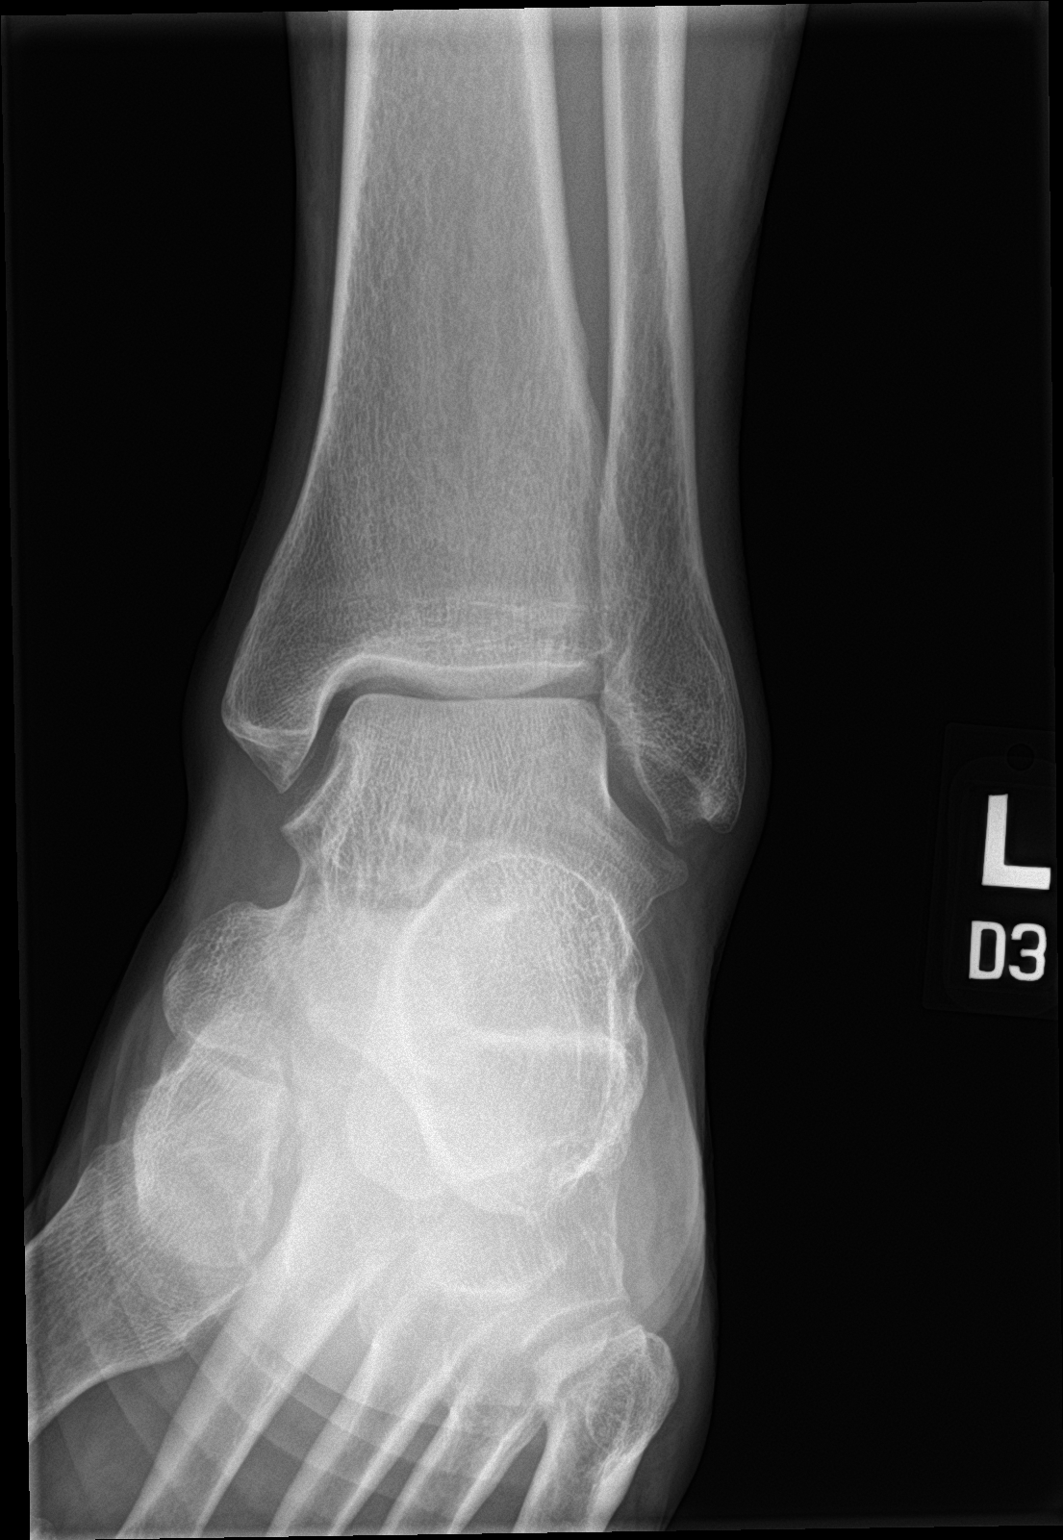

[ankle lat]
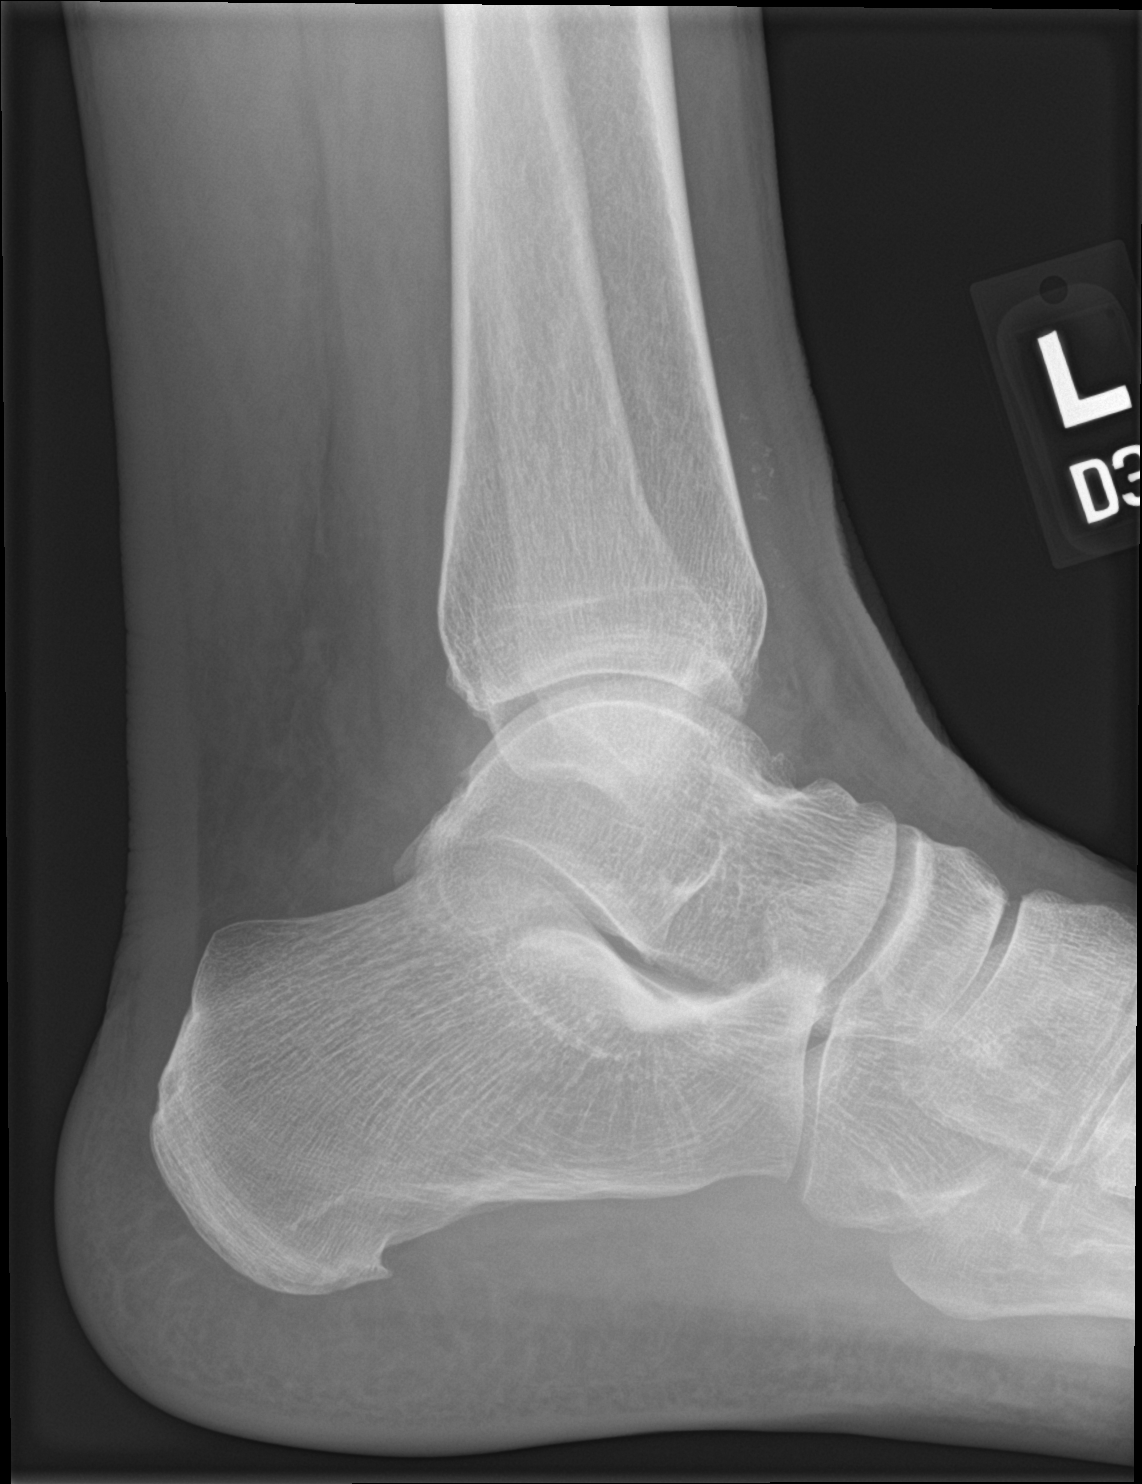

[3 of 3 positions shown; findings below may reference images not displayed]

FINDINGS: Normal alignment no fracture. Ankle joint space normal. Small joint
effusion. No significant degenerative change.
IMPRESSION: Negative for fracture.  Small joint effusion.

## 2021-12-26 IMAGING — DX DG FOOT COMPLETE 3+V*L*
3 series · 3 of 3 positions shown · non-contrast
Comparison: None.

CLINICAL DATA: Left foot pain

EXAM:
LEFT FOOT - COMPLETE 3+ VIEW

[foot ap]
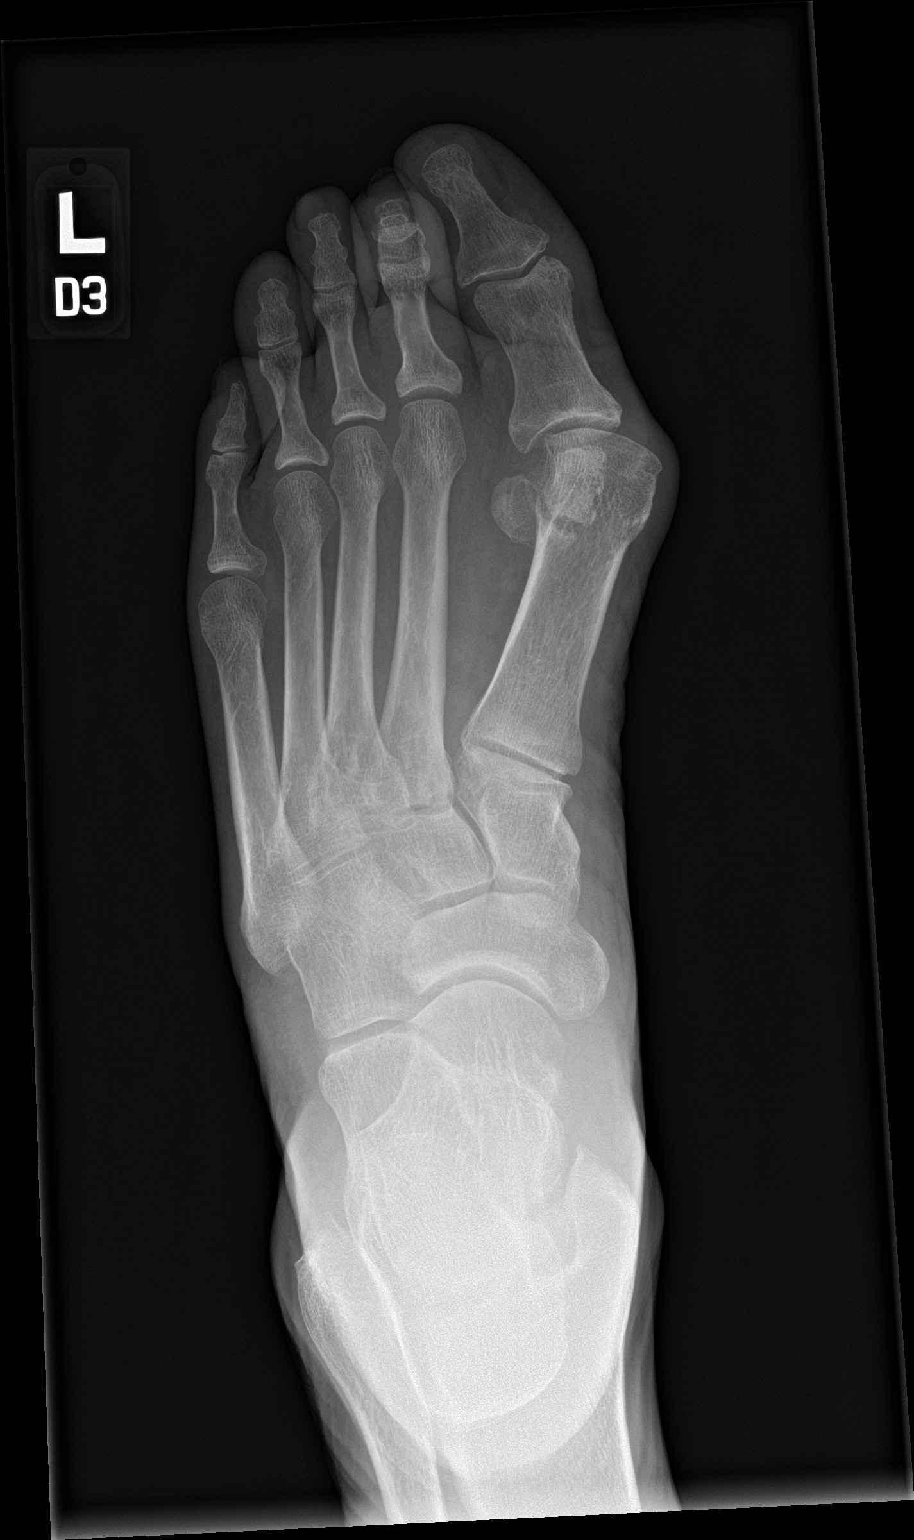

[foot obl]
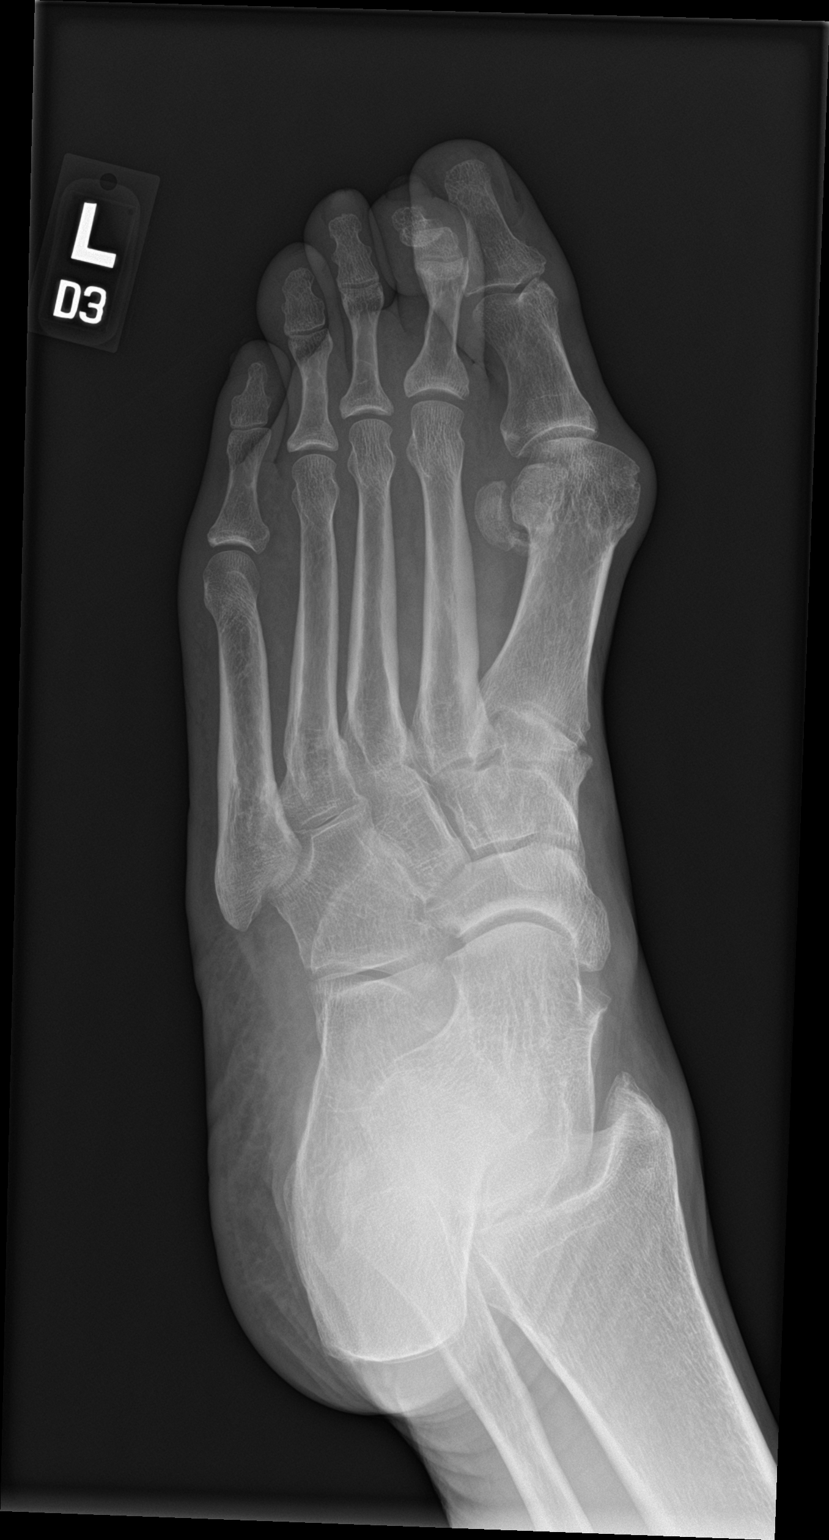

[foot lat]
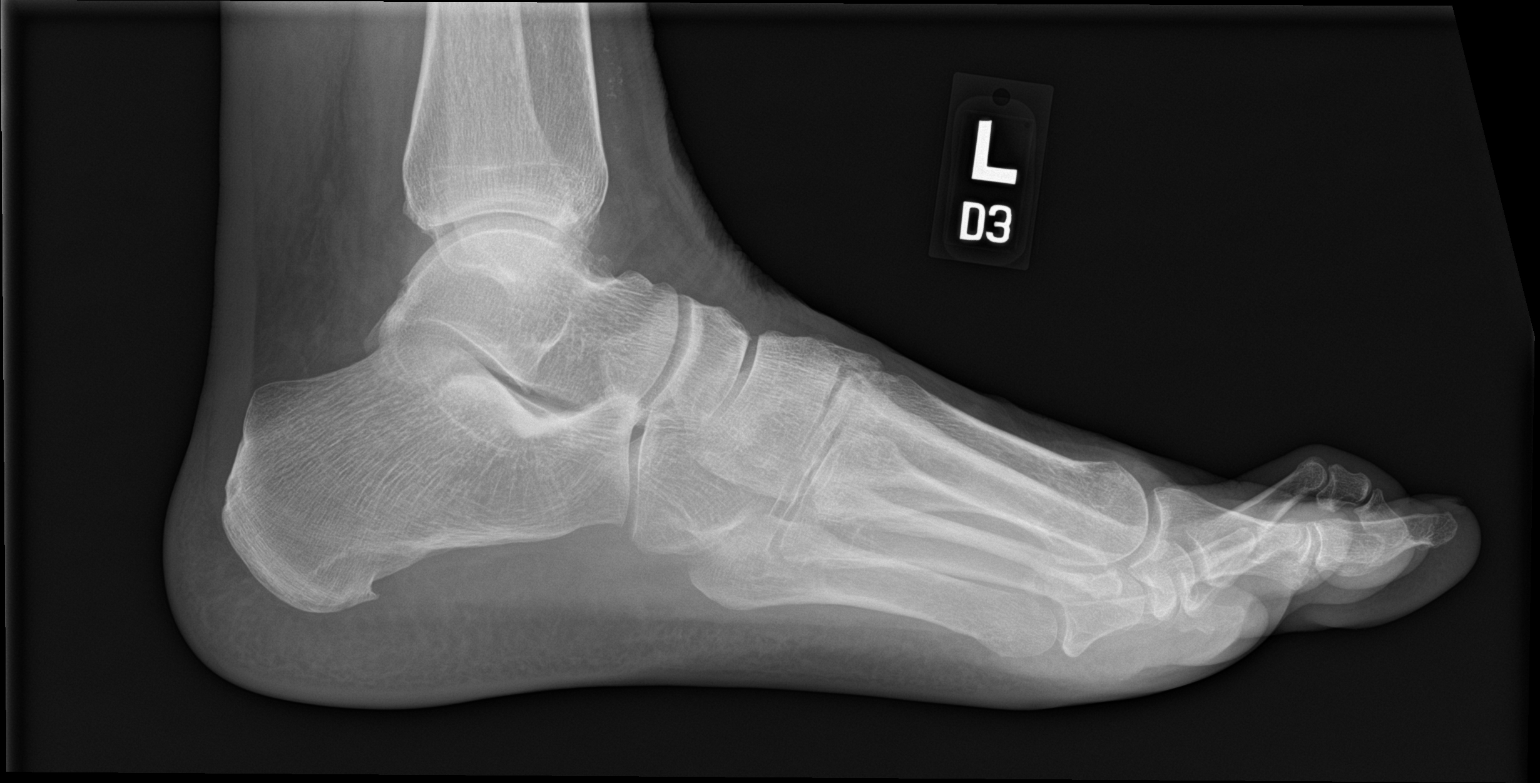

[3 of 3 positions shown; findings below may reference images not displayed]

FINDINGS: Prominent hallux valgus. Lateral subluxation of the first proximal
phalanx. Mild joint space narrowing in the first MTP.

No other arthropathy.  No erosion.  Negative for fracture.
IMPRESSION: Hallux valgus deformity.  No acute abnormality.

## 2022-06-24 ENCOUNTER — Encounter: Payer: Self-pay | Admitting: *Deleted

## 2022-07-19 ENCOUNTER — Telehealth (INDEPENDENT_AMBULATORY_CARE_PROVIDER_SITE_OTHER): Payer: Self-pay | Admitting: *Deleted

## 2022-07-19 NOTE — Telephone Encounter (Signed)
  Procedure: Colonoscopy  Height: 6'1 Weight: 210lbs        Have you had a colonoscopy before?  2013, Dr. Gala Romney  Do you have family history of colon cancer?  no  Do you have a family history of polyps? n  Previous colonoscopy with polyps removed? ono  Do you have a history colorectal cancer?   no  Are you diabetic?  no  Do you have a prosthetic or mechanical heart valve? no  Do you have a pacemaker/defibrillator?   no  Have you had endocarditis/atrial fibrillation?  no  Do you use supplemental oxygen/CPAP?  no  Have you had joint replacement within the last 12 months?  no  Do you tend to be constipated or have to use laxatives?  no   Do you have history of alcohol use? If yes, how much and how often.  Yes 3 times a week  Do you have history or are you using drugs? If yes, what do are you  using?  I have smoke marijuana before  Have you ever had a stroke/heart attack?  no  Have you ever had a heart or other vascular stent placed,?no  Do you take weight loss medication? no  Do you take any blood-thinning medications such as: (Plavix, aspirin, Coumadin, Aggrenox, Brilinta, Xarelto, Eliquis, Pradaxa, Savaysa or Effient)? nop  If yes we need the name, milligram, dosage and who is prescribing doctor:               No medications   No Known Allergies

## 2022-07-21 NOTE — Telephone Encounter (Signed)
LMOVM to call back 

## 2022-07-21 NOTE — Telephone Encounter (Signed)
Ok to schedule. aSA 2.

## 2022-07-26 ENCOUNTER — Encounter: Payer: Self-pay | Admitting: *Deleted

## 2022-07-26 ENCOUNTER — Other Ambulatory Visit: Payer: Self-pay | Admitting: *Deleted

## 2022-07-26 MED ORDER — PEG 3350-KCL-NA BICARB-NACL 420 G PO SOLR: 4000.0000 mL | Freq: Once | ORAL | 0 refills | Status: AC

## 2022-07-26 NOTE — Telephone Encounter (Signed)
Pt has been scheduled for 09/12/22. Instructions mailed and prep sent to the pharmacy.

## 2022-07-27 ENCOUNTER — Encounter: Payer: Self-pay | Admitting: *Deleted

## 2022-07-27 NOTE — Telephone Encounter (Signed)
Referral completed

## 2022-09-12 ENCOUNTER — Ambulatory Visit (HOSPITAL_COMMUNITY): Payer: 59 | Admitting: Anesthesiology

## 2022-09-12 ENCOUNTER — Encounter (HOSPITAL_COMMUNITY): Payer: Self-pay | Admitting: Internal Medicine

## 2022-09-12 ENCOUNTER — Other Ambulatory Visit: Payer: Self-pay

## 2022-09-12 ENCOUNTER — Ambulatory Visit (HOSPITAL_BASED_OUTPATIENT_CLINIC_OR_DEPARTMENT_OTHER): Payer: 59 | Admitting: Anesthesiology

## 2022-09-12 ENCOUNTER — Encounter (HOSPITAL_COMMUNITY)
Admission: RE | Disposition: A | Discharge status: Home / Self Care | Payer: Self-pay | Source: Home / Self Care | Attending: Internal Medicine

## 2022-09-12 ENCOUNTER — Ambulatory Visit (HOSPITAL_COMMUNITY)
Admission: RE | Admit: 2022-09-12 | Discharge: 2022-09-12 | Disposition: A | Discharge status: Home / Self Care | Payer: 59 | Attending: Internal Medicine | Admitting: Internal Medicine

## 2022-09-12 DIAGNOSIS — Z1211 Encounter for screening for malignant neoplasm of colon: Secondary | ICD-10-CM

## 2022-09-12 DIAGNOSIS — F17290 Nicotine dependence, other tobacco product, uncomplicated: Secondary | ICD-10-CM | POA: Diagnosis not present

## 2022-09-12 DIAGNOSIS — K635 Polyp of colon: Secondary | ICD-10-CM

## 2022-09-12 DIAGNOSIS — F17210 Nicotine dependence, cigarettes, uncomplicated: Secondary | ICD-10-CM

## 2022-09-12 HISTORY — PX: POLYPECTOMY: SHX149

## 2022-09-12 HISTORY — PX: COLONOSCOPY WITH PROPOFOL: SHX5780

## 2022-09-12 SURGERY — COLONOSCOPY WITH PROPOFOL
Anesthesia: General

## 2022-09-12 MED ORDER — STERILE WATER FOR IRRIGATION IR SOLN
Status: DC | PRN
  Administered 2022-09-12 (×2): 60 mL

## 2022-09-12 MED ORDER — LIDOCAINE HCL 1 % IJ SOLN
INTRAMUSCULAR | Status: DC | PRN
  Administered 2022-09-12: 50 mg via INTRADERMAL

## 2022-09-12 MED ORDER — PHENYLEPHRINE HCL (PRESSORS) 10 MG/ML IV SOLN
INTRAVENOUS | Status: DC | PRN
  Administered 2022-09-12: 60 ug via INTRAVENOUS
  Administered 2022-09-12: 80 ug via INTRAVENOUS

## 2022-09-12 MED ORDER — PROPOFOL 500 MG/50ML IV EMUL
INTRAVENOUS | Status: DC | PRN
  Administered 2022-09-12: 150 ug/kg/min via INTRAVENOUS

## 2022-09-12 MED ORDER — PROPOFOL 10 MG/ML IV BOLUS
INTRAVENOUS | Status: DC | PRN
  Administered 2022-09-12 (×3): 50 mg via INTRAVENOUS
  Administered 2022-09-12: 100 mg via INTRAVENOUS

## 2022-09-12 MED ORDER — LACTATED RINGERS IV SOLN: INTRAVENOUS | Status: DC

## 2022-09-12 NOTE — Op Note (Signed)
[facility] [address] Patient Name: Chad Joseph Procedure Date: 09/12/2022 8:49 AM MRN: [id number] Date of Birth: March 03, 1961 Attending MD: Gennette Pac , MD, [id number] CSN: [id number] Age: 62 Admit Type: Outpatient Procedure:                Colonoscopy Indications:              Screening for colorectal malignant neoplasm Providers:                Gennette Pac, MD, Crystal Page, Durwin Glaze Tech, Technician Referring MD:              Medicines:                Propofol per Anesthesia Complications:            No immediate complications. Estimated Blood Loss:     Estimated blood loss was minimal. Procedure:                Pre-Anesthesia Assessment:                           - Prior to the procedure, a History and Physical                            was performed, and patient medications and                            allergies were reviewed. The patient's tolerance of                            previous anesthesia was also reviewed. The risks                            and benefits of the procedure and the sedation                            options and risks were discussed with the patient.                            All questions were answered, and informed consent                            was obtained. Prior Anticoagulants: The patient has                            taken no anticoagulant or antiplatelet agents. ASA                            Grade Assessment: II - A patient with mild systemic                            disease. After reviewing the risks and benefits,  the patient was deemed in satisfactory condition to                            undergo the procedure.                           After obtaining informed consent, the colonoscope                            was passed under direct vision. Throughout the                            procedure, the patient's blood pressure, pulse, and                             oxygen saturations were monitored continuously. The                            [phone]) scope was introduced through the                            anus and advanced to the the cecum, identified by                            appendiceal orifice and ileocecal valve. The                            colonoscopy was performed without difficulty. The                            patient tolerated the procedure well. The quality                            of the bowel preparation was adequate. The entire                            colon was well visualized. Scope In: 9:15:25 AM Scope Out: 9:26:36 AM Scope Withdrawal Time: 0 hours 8 minutes 40 seconds  Total Procedure Duration: 0 hours 11 minutes 11 seconds  Findings:      The perianal and digital rectal examinations were normal.      Two semi-pedunculated polyps were found in the mid descending colon. The       polyps were 4 to 5 mm in size. These polyps were removed with a cold       snare. Resection and retrieval were complete. Estimated blood loss was       minimal.      The exam was otherwise without abnormality on direct and retroflexion       views. Impression:               - Two 4 to 5 mm polyps in the mid descending colon,                            removed with a cold snare. Resected and retrieved.                           -  The examination was otherwise normal on direct                            and retroflexion views. Moderate Sedation:      Moderate (conscious) sedation was personally administered by an       anesthesia professional. The following parameters were monitored: oxygen       saturation, heart rate, blood pressure, respiratory rate, EKG, adequacy       of pulmonary ventilation, and response to care. Recommendation:           - Patient has a contact number available for                            emergencies. The signs and symptoms of potential                            delayed complications were  discussed with the                            patient. Return to normal activities tomorrow.                            Written discharge instructions were provided to the                            patient.                           - Resume previous diet.                           - Continue present medications.                           - Repeat colonoscopy date to be determined after                            pending pathology results are reviewed for                            surveillance.                           - Return to GI office (date not yet determined). Procedure Code(s):        --- Professional ---                           [phone], Colonoscopy, flexible; with removal of                            tumor(s), polyp(s), or other lesion(s) by snare                            technique Diagnosis Code(s):        --- Professional ---  Z12.11, Encounter for screening for malignant                            neoplasm of colon                           D12.4, Benign neoplasm of descending colon CPT copyright 2022 American Medical Association. All rights reserved. The codes documented in this report are preliminary and upon coder review may  be revised to meet current compliance requirements. Gerrit Friends. Yader Criger, MD Gennette Pac, MD 09/12/2022 9:30:59 AM This report has been signed electronically. Number of Addenda: 0

## 2022-09-12 NOTE — Anesthesia Postprocedure Evaluation (Signed)
Anesthesia Post Note  Patient: Chad Joseph  Procedure(s) Performed: COLONOSCOPY WITH PROPOFOL POLYPECTOMY INTESTINAL  Patient location during evaluation: Endoscopy Anesthesia Type: General Level of consciousness: awake and alert Pain management: pain level controlled Vital Signs Assessment: post-procedure vital signs reviewed and stable Respiratory status: spontaneous breathing Cardiovascular status: blood pressure returned to baseline and stable Postop Assessment: no apparent nausea or vomiting Anesthetic complications: no   No notable events documented.   Last Vitals:  Vitals:   09/12/22 0749 09/12/22 0932  BP: 112/80 (!) 74/47  Pulse:  76  Resp: 16 12  Temp: 36.7 C 36.4 C  SpO2: 97% 96%    Last Pain:  Vitals:   09/12/22 0932  TempSrc: Axillary  PainSc:                  [name]

## 2022-09-12 NOTE — Discharge Instructions (Signed)
  Colonoscopy Discharge Instructions  Read the instructions outlined below and refer to this sheet in the next few weeks. These discharge instructions provide you with general information on caring for yourself after you leave the hospital. Your doctor may also give you specific instructions. While your treatment has been planned according to the most current medical practices available, unavoidable complications occasionally occur. If you have any problems or questions after discharge, call Dr. Jena Gauss at [phone]. ACTIVITY You may resume your regular activity, but move at a slower pace for the next 24 hours.  Take frequent rest periods for the next 24 hours.  Walking will help get rid of the air and reduce the bloated feeling in your belly (abdomen).  No driving for 24 hours (because of the medicine (anesthesia) used during the test).   Do not sign any important legal documents or operate any machinery for 24 hours (because of the anesthesia used during the test).  NUTRITION Drink plenty of fluids.  You may resume your normal diet as instructed by your doctor.  Begin with a light meal and progress to your normal diet. Heavy or fried foods are harder to digest and may make you feel sick to your stomach (nauseated).  Avoid alcoholic beverages for 24 hours or as instructed.  MEDICATIONS You may resume your normal medications unless your doctor tells you otherwise.  WHAT YOU CAN EXPECT TODAY Some feelings of bloating in the abdomen.  Passage of more gas than usual.  Spotting of blood in your stool or on the toilet paper.  IF YOU HAD POLYPS REMOVED DURING THE COLONOSCOPY: No aspirin products for 7 days or as instructed.  No alcohol for 7 days or as instructed.  Eat a soft diet for the next 24 hours.  FINDING OUT THE RESULTS OF YOUR TEST Not all test results are available during your visit. If your test results are not back during the visit, make an appointment with your caregiver to find out the  results. Do not assume everything is normal if you have not heard from your caregiver or the medical facility. It is important for you to follow up on all of your test results.  SEEK IMMEDIATE MEDICAL ATTENTION IF: You have more than a spotting of blood in your stool.  Your belly is swollen (abdominal distention).  You are nauseated or vomiting.  You have a temperature over 101.  You have abdominal pain or discomfort that is severe or gets worse throughout the day.     2 polyps removed from your colon today  Further recommendations to follow pending review of pathology report   at patient request, I called Jorryn Casagrande at [phone] -  rolled to voicemail.  Left a message.

## 2022-09-12 NOTE — Transfer of Care (Signed)
Immediate Anesthesia Transfer of Care Note  Patient: Chad Joseph  Procedure(s) Performed: COLONOSCOPY WITH PROPOFOL POLYPECTOMY INTESTINAL  Patient Location: Endoscopy Unit  Anesthesia Type:General  Level of Consciousness: awake  Airway & Oxygen Therapy: Patient Spontanous Breathing  Post-op Assessment: Report given to RN  Post vital signs: Reviewed and stable  Last Vitals:  Vitals Value Taken Time  BP 74/47 09/12/22 0932  Temp 36.4 C 09/12/22 0932  Pulse 76 09/12/22 0932  Resp 12 09/12/22 0932  SpO2 96 % 09/12/22 0932    Last Pain:  Vitals:   09/12/22 0932  TempSrc: Axillary  PainSc:       Patients Stated Pain Goal: 5 (09/12/22 0749)  Complications: No notable events documented.

## 2022-09-12 NOTE — H&P (Signed)
@LOGO @   Primary Care Physician:  Elfredia Nevins, MD Primary Gastroenterologist:  Dr. Jena Gauss  Pre-Procedure History & Physical: HPI:  Chad Joseph is a 62 y.o. male here for [place] colonoscopy.  Negative colonoscopy (benign polyp) 2013.  No bowel symptoms at this time.  Past Medical History:  Diagnosis Date   Foot pain    Hemorrhoids     Past Surgical History:  Procedure Laterality Date   COLONOSCOPY  07/08/2011   Procedure: COLONOSCOPY;  Surgeon: Corbin Ade, MD;  Location: [facility];  Service: Endoscopy;  Laterality: N/A;  9:45   INGUINAL HERNIA REPAIR     NECK SURGERY      Prior to Admission medications   Medication Sig Start Date End Date Taking? Authorizing Provider  Ibuprofen 200 MG CAPS Take 200 mg by mouth 3 (three) times daily as needed (pain).   Yes [provider]  Multiple Vitamin (MULTIVITAMIN WITH MINERALS) TABS tablet Take 2 tablets by mouth daily.   Yes [provider]  Naphazoline HCl (CLEAR EYES OP) Place 1 drop into both eyes daily.   Yes [provider]  tadalafil (CIALIS) 5 MG tablet Take 5-20 mg by mouth daily as needed for erectile dysfunction. 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  Tobacco Use   Smoking status: Every Day    Types: Cigars   Smokeless tobacco: Not on file   Tobacco comments:    one pack a week(5 in a pack)  Vaping Use   Vaping Use: Never used  Substance and Sexual Activity   Alcohol use: Yes    Alcohol/week: 10.0 standard drinks of alcohol    Types: 10 Shots of liquor per week    Comment: pint a  week   Drug use: Yes    Types: Marijuana    Comment: last used 2 weeks   Sexual activity: Not on file  Other Topics Concern   Not on file  Social History Narrative   Not on file   Social Determinants of Health   Financial Resource Strain: Not on file  Food Insecurity: Not on file  Transportation Needs: Not on file  Physical Activity: Not on file  Stress: Not on file  Social Connections: Not on file  Intimate Partner Violence: Not on file    Review of Systems: See HPI, otherwise negative ROS  Physical Exam: BP 112/80   Temp 98.1 F (36.7 C) (Oral)   Resp 16   SpO2 97%  General:   Alert,  Well-developed, well-nourished, pleasant and cooperative in NAD Neck:  Supple; no masses or thyromegaly. No significant cervical adenopathy. Lungs:  Clear throughout to auscultation.   No wheezes, crackles, or rhonchi. No acute distress. Heart:  Regular rate and rhythm; no murmurs, clicks, rubs,  or gallops. Abdomen: Non-distended, normal bowel sounds.  Soft and nontender without appreciable mass or hepatosplenomegaly.  Pulses:  Normal pulses noted. Extremities:  Without clubbing or edema.  Impression/Plan: 62 year old gentleman here for screening colonoscopy.  Average risk.  Benign  polyp removed 2013. The risks, benefits, limitations, alternatives and imponderables have been reviewed with the patient. Questions have been answered. All parties are agreeable.       Notice: This dictation was prepared with Dragon dictation along with smaller phrase technology. Any transcriptional errors that result from this process are unintentional and may not be corrected upon review.

## 2022-09-12 NOTE — Anesthesia Preprocedure Evaluation (Signed)
Anesthesia Evaluation  Patient identified by MRN, date of birth, ID band Patient awake    Reviewed: Allergy & Precautions, H&P , NPO status , Patient's Chart, lab work & pertinent test results, reviewed documented beta blocker date and time   Airway Mallampati: II  TM Distance: >3 FB Neck ROM: full    Dental no notable dental hx.    Pulmonary neg pulmonary ROS, Current Smoker and Patient abstained from smoking.   Pulmonary exam normal breath sounds clear to auscultation       Cardiovascular Exercise Tolerance: Good negative cardio ROS  Rhythm:regular Rate:Normal     Neuro/Psych negative neurological ROS  negative psych ROS   GI/Hepatic negative GI ROS, Neg liver ROS,,,  Endo/Other  negative endocrine ROS    Renal/GU negative Renal ROS  negative genitourinary   Musculoskeletal   Abdominal   Peds  Hematology negative hematology ROS (+)   Anesthesia Other Findings   Reproductive/Obstetrics negative OB ROS                             Anesthesia Physical Anesthesia Plan  ASA: 2  Anesthesia Plan: General   Post-op Pain Management:    Induction:   PONV Risk Score and Plan: Propofol infusion  Airway Management Planned:   Additional Equipment:   Intra-op Plan:   Post-operative Plan:   Informed Consent: I have reviewed the patients History and Physical, chart, labs and discussed the procedure including the risks, benefits and alternatives for the proposed anesthesia with the patient or authorized representative who has indicated his/her understanding and acceptance.     Dental Advisory Given  Plan Discussed with: CRNA  Anesthesia Plan Comments:        Anesthesia Quick Evaluation  

## 2022-09-13 ENCOUNTER — Encounter: Payer: Self-pay | Admitting: Internal Medicine

## 2022-09-13 LAB — SURGICAL PATHOLOGY

## 2022-09-15 ENCOUNTER — Encounter (HOSPITAL_COMMUNITY): Payer: Self-pay | Admitting: Internal Medicine

## 2024-05-07 ENCOUNTER — Ambulatory Visit
Admission: EM | Admit: 2024-05-07 | Discharge: 2024-05-07 | Disposition: A | Discharge status: Home / Self Care | Attending: Family Medicine | Admitting: Family Medicine

## 2024-05-07 DIAGNOSIS — H1132 Conjunctival hemorrhage, left eye: Secondary | ICD-10-CM | POA: Diagnosis not present

## 2024-05-07 MED ORDER — ERYTHROMYCIN 5 MG/GM OP OINT: TOPICAL_OINTMENT | OPHTHALMIC | 0 refills | Status: AC

## 2024-05-07 NOTE — ED Triage Notes (Signed)
 Pt reports he has left eye redness x 1 day

## 2024-05-07 NOTE — ED Provider Notes (Signed)
 " [facility]    CSN: [id number] Arrival date & time: 05/07/24  1519      History   Chief Complaint No chief complaint on file.   HPI Chad Joseph is a 63 y.o. male.   Patient presenting today with 1 day history of medial left eye redness, slight irritation.  Denies itching, visual change, injury to the eye, headache.  So far trying Visine drops with minimal relief.    Past Medical History:  Diagnosis Date   Foot pain    Hemorrhoids     Patient Active Problem List   Diagnosis Date Noted   Rectal bleeding 06/09/2011    Past Surgical History:  Procedure Laterality Date   COLONOSCOPY  07/08/2011   Procedure: COLONOSCOPY;  Surgeon: Lamar CHRISTELLA Hollingshead, MD;  Location: [facility];  Service: Endoscopy;  Laterality: N/A;  9:45   COLONOSCOPY WITH PROPOFOL  N/A 09/12/2022   Procedure: COLONOSCOPY WITH PROPOFOL ;  Surgeon: Hollingshead Lamar CHRISTELLA, MD;  Location: [facility];  Service: Endoscopy;  Laterality: N/A;  9:00 am, asa 2   INGUINAL HERNIA REPAIR     NECK SURGERY     POLYPECTOMY  09/12/2022   Procedure: POLYPECTOMY INTESTINAL;  Surgeon: Hollingshead Lamar CHRISTELLA, MD;  Location: [facility];  Service: Endoscopy;;       Home Medications    Prior to Admission medications  Medication Sig Start Date End Date Taking? Authorizing Provider  erythromycin  ophthalmic ointment Place a 1/2 inch ribbon of ointment into the left lower eyelid BID. 05/07/24  Yes Stuart Vernell Norris, PA-C  Ibuprofen 200 MG CAPS Take 200 mg by mouth 3 (three) times daily as needed (pain).    [provider]  Multiple Vitamin (MULTIVITAMIN WITH MINERALS) TABS tablet Take 2 tablets by mouth daily.    [provider]  Naphazoline HCl (CLEAR EYES OP) Place 1 drop into both eyes daily.    [provider]  tadalafil (CIALIS) 5 MG tablet Take 5-20 mg by mouth daily as needed for erectile dysfunction. 06/23/22   [provider]    Family History Family History  Problem  Relation Age of Onset   Colon cancer Neg Hx     Social History Social History[1]   Allergies   Patient has no known allergies.   Review of Systems Review of Systems PER HPI  Physical Exam Triage Vital Signs ED Triage Vitals  Encounter Vitals Group     BP 05/07/24 1526 116/79     Girls Systolic BP Percentile --      Girls Diastolic BP Percentile --      Boys Systolic BP Percentile --      Boys Diastolic BP Percentile --      Pulse Rate 05/07/24 1526 89     Resp 05/07/24 1526 20     Temp 05/07/24 1526 98.3 F (36.8 C)     Temp Source 05/07/24 1526 Oral     SpO2 05/07/24 1526 95 %     Weight --      Height --      Head Circumference --      Peak Flow --      Pain Score 05/07/24 1525 5     Pain Loc --      Pain Education --      Exclude from Growth Chart --    No data found.  Updated Vital Signs BP 116/79 (BP Location: Right Arm)   Pulse 89   Temp 98.3 F (  36.8 C) (Oral)   Resp 20   SpO2 95%   Visual Acuity Right Eye Distance: 20/25 Left Eye Distance: 20/30 Bilateral Distance: 20/20  Right Eye Near:   Left Eye Near:    Bilateral Near:     Physical Exam   UC Treatments / Results  Labs (all labs ordered are listed, but only abnormal results are displayed) Labs Reviewed - No data to display  EKG   Radiology No results found.  Procedures Procedures (including critical care time)  Medications Ordered in UC Medications - No data to display  Initial Impression / Assessment and Plan / UC Course  I have reviewed the triage vital signs and the nursing notes.  Pertinent labs & imaging results that were available during my care of the patient were reviewed by me and considered in my medical decision making (see chart for details).     Visual acuity reassuring, vitals and exam also reassuring.  Consistent with subconjunctival hemorrhage.  Will provide erythromycin  ointment mainly for comfort and to prevent infection and discussed compresses,  supportive home care.  Return for worsening or unresolving symptoms.  Final Clinical Impressions(s) / UC Diagnoses   Final diagnoses:  Subconjunctival hemorrhage of left eye     Discharge Instructions      I have sent in some antibiotic ointment for your eye just as a comfort measure more than anything.  There is no active infection in the eye, this is just a broken blood vessel basically causing a bruise to the surface of the eye and will resolve completely on its own.  You may apply compresses for comfort     ED Prescriptions     Medication Sig Dispense Auth. Provider   erythromycin  ophthalmic ointment Place a 1/2 inch ribbon of ointment into the left lower eyelid BID. 3.5 g Stuart Vernell Norris, PA-C      PDMP not reviewed this encounter.    [1]  Social History Tobacco Use   Smoking status: Every Day    Types: Cigars   Tobacco comments:    one pack a week(5 in a pack)  Vaping Use   Vaping status: Never Used  Substance Use Topics   Alcohol use: Yes    Alcohol/week: 10.0 standard drinks of alcohol    Types: 10 Shots of liquor per week    Comment: pint a week   Drug use: Yes    Types: Marijuana    Comment: last used 2 weeks     Stuart Vernell Norris, PA-C 05/07/24 1614  "

## 2024-05-07 NOTE — Discharge Instructions (Addendum)
 I have sent in some antibiotic ointment for your eye just as a comfort measure more than anything.  There is no active infection in the eye, this is just a broken blood vessel basically causing a bruise to the surface of the eye and will resolve completely on its own.  You may apply compresses for comfort
